# Patient Record
Sex: Female | Born: 1975 | Race: White | Hispanic: No | Marital: Married
Health system: Southern US, Community
[De-identification: ages and names within clinical notes are randomized; demographics above are authoritative.]

## PROBLEM LIST (undated history)

## (undated) HISTORY — PX: TONSILLECTOMY: SUR1361

---

## 2008-11-04 ENCOUNTER — Emergency Department: Payer: Self-pay | Admitting: Emergency Medicine

## 2017-07-21 ENCOUNTER — Other Ambulatory Visit: Payer: Self-pay | Admitting: Obstetrics and Gynecology

## 2017-07-21 DIAGNOSIS — R928 Other abnormal and inconclusive findings on diagnostic imaging of breast: Secondary | ICD-10-CM

## 2017-07-25 ENCOUNTER — Ambulatory Visit
Admission: RE | Admit: 2017-07-25 | Discharge: 2017-07-25 | Disposition: A | Discharge status: Home / Self Care | Payer: BLUE CROSS/BLUE SHIELD | Source: Ambulatory Visit | Attending: Obstetrics and Gynecology | Admitting: Obstetrics and Gynecology

## 2017-07-25 ENCOUNTER — Other Ambulatory Visit: Payer: Self-pay | Admitting: Obstetrics and Gynecology

## 2017-07-25 DIAGNOSIS — N6320 Unspecified lump in the left breast, unspecified quadrant: Secondary | ICD-10-CM

## 2017-07-25 DIAGNOSIS — R928 Other abnormal and inconclusive findings on diagnostic imaging of breast: Secondary | ICD-10-CM

## 2018-01-26 ENCOUNTER — Ambulatory Visit
Admission: RE | Admit: 2018-01-26 | Discharge: 2018-01-26 | Disposition: A | Discharge status: Home / Self Care | Payer: BLUE CROSS/BLUE SHIELD | Source: Ambulatory Visit | Attending: Obstetrics and Gynecology | Admitting: Obstetrics and Gynecology

## 2018-01-26 ENCOUNTER — Other Ambulatory Visit: Payer: Self-pay | Admitting: Obstetrics and Gynecology

## 2018-01-26 DIAGNOSIS — N6320 Unspecified lump in the left breast, unspecified quadrant: Secondary | ICD-10-CM

## 2018-07-23 ENCOUNTER — Other Ambulatory Visit: Payer: Self-pay | Admitting: Obstetrics and Gynecology

## 2018-07-23 DIAGNOSIS — N6320 Unspecified lump in the left breast, unspecified quadrant: Secondary | ICD-10-CM

## 2018-07-27 ENCOUNTER — Ambulatory Visit
Admission: RE | Admit: 2018-07-27 | Discharge: 2018-07-27 | Disposition: A | Discharge status: Home / Self Care | Payer: BLUE CROSS/BLUE SHIELD | Source: Ambulatory Visit | Attending: Obstetrics and Gynecology | Admitting: Obstetrics and Gynecology

## 2018-07-27 ENCOUNTER — Other Ambulatory Visit: Payer: Self-pay

## 2018-07-27 DIAGNOSIS — N6320 Unspecified lump in the left breast, unspecified quadrant: Secondary | ICD-10-CM

## 2019-08-12 ENCOUNTER — Ambulatory Visit: Payer: PRIVATE HEALTH INSURANCE | Attending: Internal Medicine

## 2019-08-12 DIAGNOSIS — Z23 Encounter for immunization: Secondary | ICD-10-CM

## 2019-08-12 NOTE — Progress Notes (Signed)
   Covid-19 Vaccination Clinic  Name:  [name]    MRN: [id number] DOB: Dec 08, 1975  08/12/2019  [name] was observed post Covid-19 immunization for 15 minutes without incident. She was provided with Vaccine Information Sheet and instruction to access the V-Safe system.   [name] was instructed to call 911 with any severe reactions post vaccine: Marland Kitchen Difficulty breathing  . Swelling of face and throat  . A fast heartbeat  . A bad rash all over body  . Dizziness and weakness   Immunizations Administered    Name Date Dose VIS Date Route   Pfizer COVID-19 Vaccine 08/12/2019  6:53 PM 0.3 mL 05/19/2018 Intramuscular   Manufacturer: [employer], [employer]   Lot: [id number]   NDC: [address]-8

## 2019-09-02 ENCOUNTER — Ambulatory Visit: Payer: PRIVATE HEALTH INSURANCE | Attending: Internal Medicine

## 2019-09-02 DIAGNOSIS — Z23 Encounter for immunization: Secondary | ICD-10-CM

## 2019-09-02 NOTE — Progress Notes (Signed)
   Covid-19 Vaccination Clinic  Name:  Ruth Luna    MRN: [id number] DOB: Nov 27, 1975  09/02/2019  Ruth Luna was observed post Covid-19 immunization for 15 minutes without incident. She was provided with Vaccine Information Sheet and instruction to access the V-Safe system.   Ruth Luna was instructed to call 911 with any severe reactions post vaccine: Marland Kitchen Difficulty breathing  . Swelling of face and throat  . A fast heartbeat  . A bad rash all over body  . Dizziness and weakness   Immunizations Administered    Name Date Dose VIS Date Route   Pfizer COVID-19 Vaccine 09/02/2019  4:51 PM 0.3 mL 05/19/2018 Intramuscular   Manufacturer: [employer], [employer]   Lot: [id number]   NDC: [address]-1

## 2019-10-12 IMAGING — MG DIGITAL DIAGNOSTIC BILATERAL MAMMOGRAM WITH TOMO AND CAD
8 series · 8 of 24 positions shown · non-contrast
Comparison: Previous exam(s).

CLINICAL DATA: 43-year-old female presenting for follow-up of
probably benign left breast masses.

EXAM:
DIGITAL DIAGNOSTIC BILATERAL MAMMOGRAM WITH CAD AND TOMO
ULTRASOUND LEFT BREAST

[R CC synth-2D]
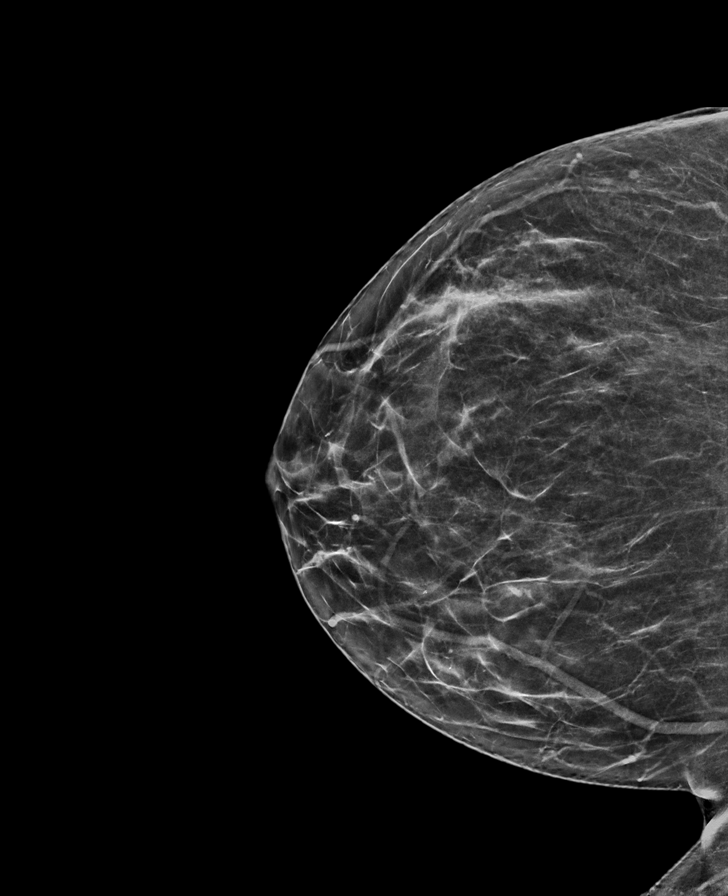

[L CC synth-2D]
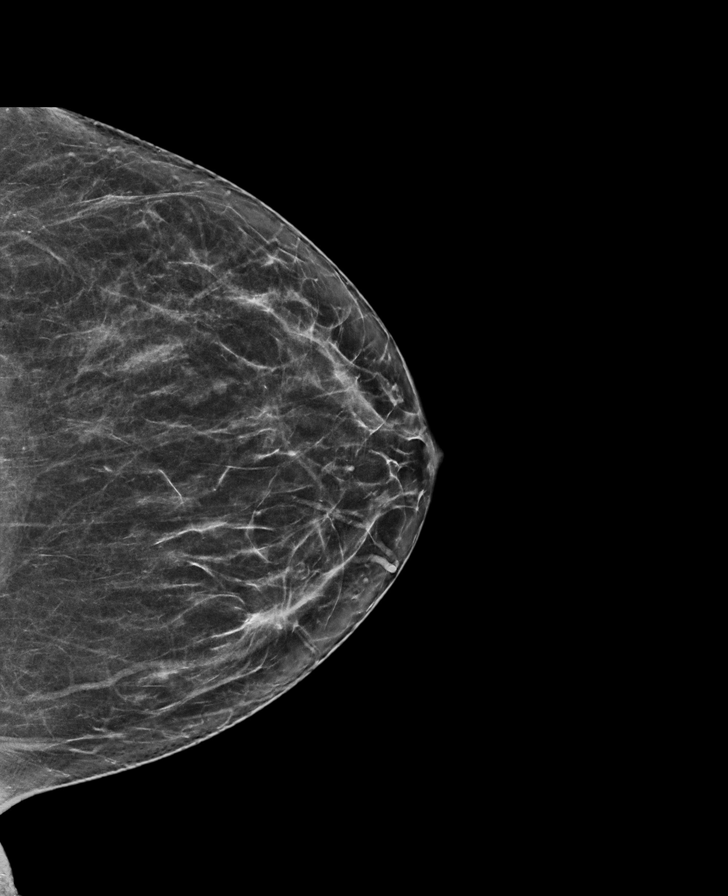

[L MLO synth-2D]
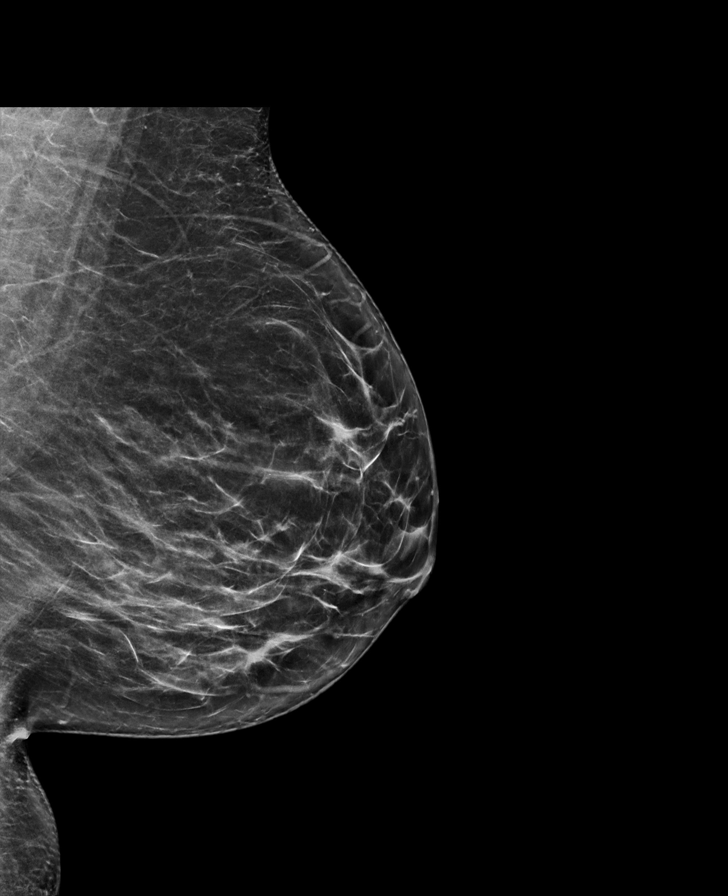

[R MLO synth-2D]
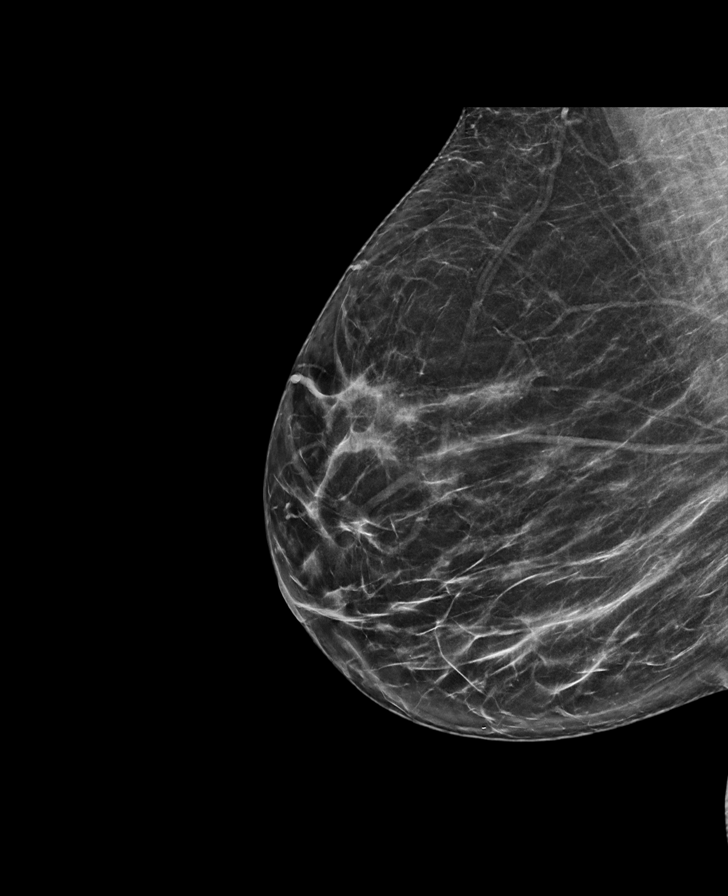

[L CC tomo · tomo slice 35/68.0]
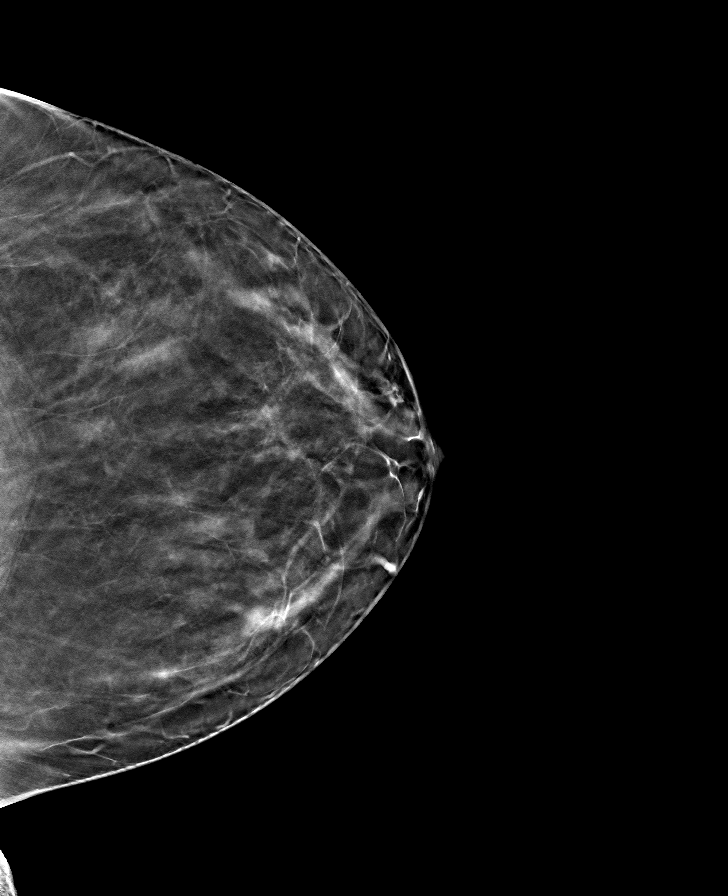

[R MLO tomo · tomo slice 35/69.0]
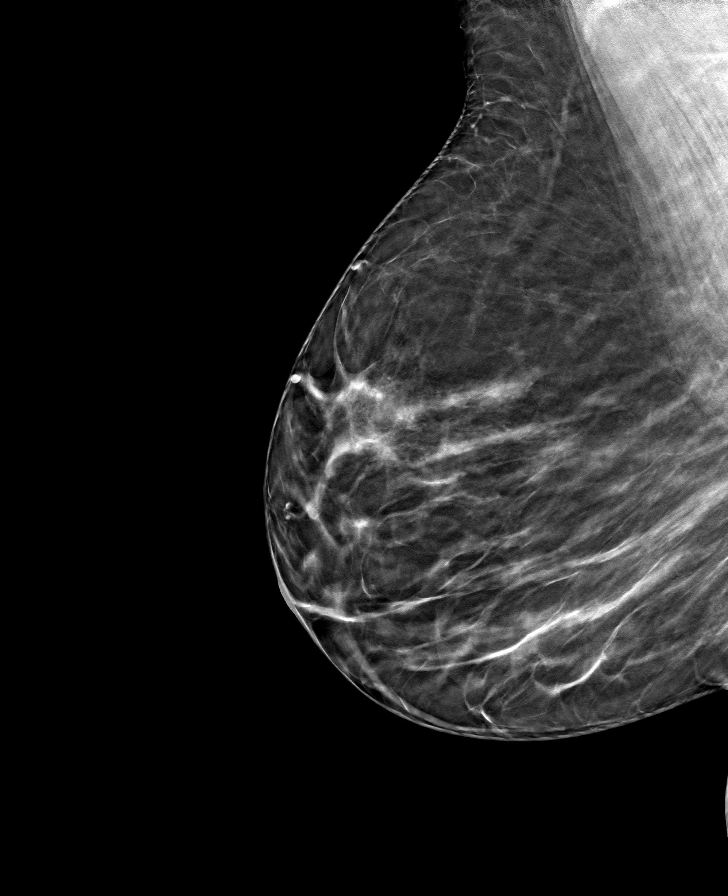

[R CC tomo · tomo slice 33/65.0]
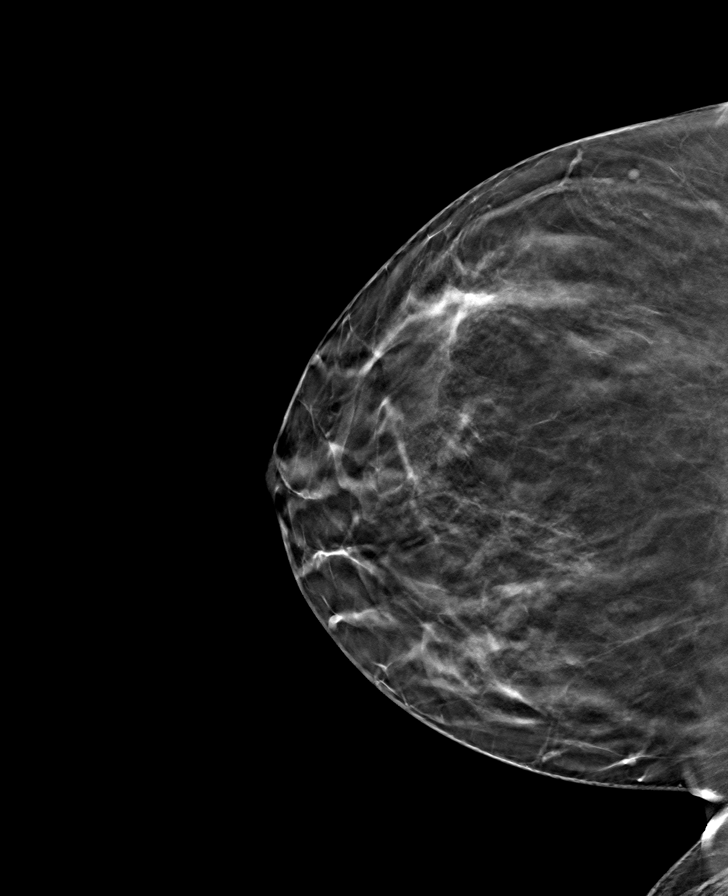

[L MLO tomo · tomo slice 37/72.0]
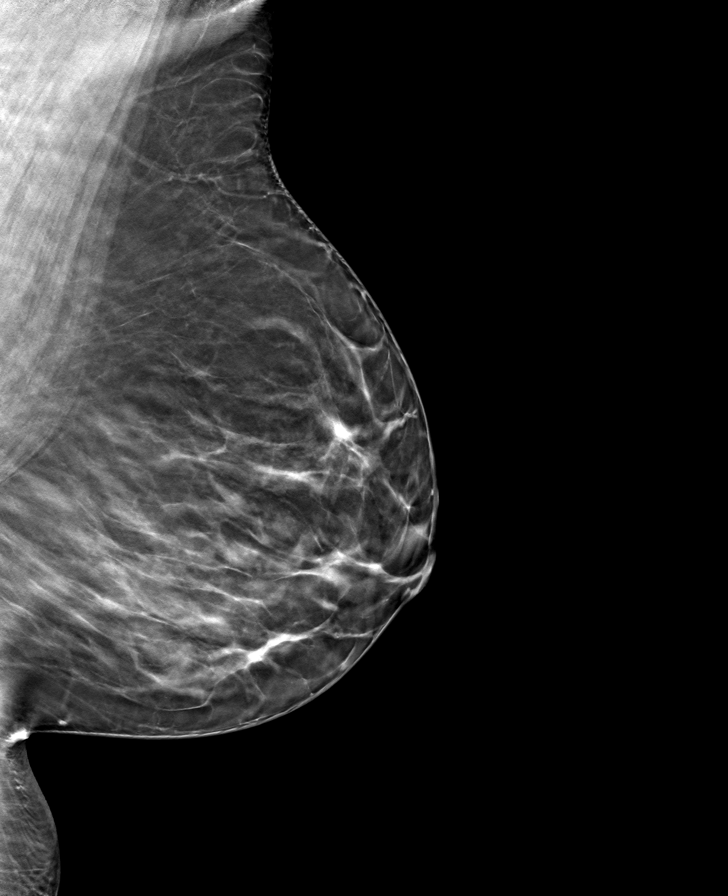

[8 of 24 positions shown; findings below may reference images not displayed]

ACR Breast Density Category b: There are scattered areas of
fibroglandular density.
FINDINGS: Two the circumscribed oval masses previously seen in the central to
lateral left breast have completely resolved, and a third is
markedly smaller. No suspicious calcifications, masses or areas of
distortion are seen in the bilateral breasts.

Mammographic images were processed with CAD.

Ultrasound of the lower outer quadrant of the left breast
demonstrates a benign appearing prominent duct and scattered
fibrocystic change. No suspicious masses are identified.
IMPRESSION: 1. Resolution or significant decrease in size of the probably benign
left breast masses.

2.  No mammographic evidence of malignancy in the bilateral breasts.

RECOMMENDATION:
Screening mammogram in one year.(Code:[id number])

I have discussed the findings and recommendations with the patient.
Results were also provided in writing at the conclusion of the
visit. If applicable, a reminder letter will be sent to the patient
regarding the next appointment.

BI-RADS CATEGORY  2: Benign.

## 2024-01-30 ENCOUNTER — Other Ambulatory Visit: Payer: Self-pay

## 2024-01-30 DIAGNOSIS — Z1231 Encounter for screening mammogram for malignant neoplasm of breast: Secondary | ICD-10-CM

## 2024-02-21 ENCOUNTER — Ambulatory Visit
Admission: EM | Admit: 2024-02-21 | Discharge: 2024-02-21 | Disposition: A | Discharge status: Home / Self Care | Attending: Nurse Practitioner | Admitting: Nurse Practitioner

## 2024-02-21 ENCOUNTER — Other Ambulatory Visit: Payer: Self-pay

## 2024-02-21 DIAGNOSIS — N3001 Acute cystitis with hematuria: Secondary | ICD-10-CM | POA: Diagnosis present

## 2024-02-21 DIAGNOSIS — R319 Hematuria, unspecified: Secondary | ICD-10-CM | POA: Insufficient documentation

## 2024-02-21 DIAGNOSIS — R300 Dysuria: Secondary | ICD-10-CM | POA: Insufficient documentation

## 2024-02-21 LAB — POCT URINE PREGNANCY: Preg Test, Ur: NEGATIVE

## 2024-02-21 LAB — POCT URINE DIPSTICK
Bilirubin, UA: NEGATIVE
Glucose, UA: NEGATIVE mg/dL
Ketones, POC UA: NEGATIVE mg/dL
Nitrite, UA: NEGATIVE
POC PROTEIN,UA: NEGATIVE
Spec Grav, UA: 1.015 (ref 1.010–1.025)
Urobilinogen, UA: 0.2 U/dL
pH, UA: 7 (ref 5.0–8.0)

## 2024-02-21 MED ORDER — NITROFURANTOIN MONOHYD MACRO 100 MG PO CAPS: 100.0000 mg | ORAL_CAPSULE | Freq: Two times a day (BID) | ORAL | 0 refills | Status: AC

## 2024-02-21 MED ORDER — PHENAZOPYRIDINE HCL 200 MG PO TABS: 200.0000 mg | ORAL_TABLET | ORAL | 0 refills | Status: AC

## 2024-02-21 NOTE — Discharge Instructions (Addendum)
 You were seen today for symptoms consistent with a urinary tract infection (UTI). You have been prescribed Macrobid  to treat the infection and Pyridium  to help relieve discomfort such as burning, urgency, and bladder pressure. Take the antibiotics exactly as prescribed and complete the full course, even if you start feeling better. Pyridium  may cause your urine to change color, which is a normal side effect of the medication. A urine culture has been sent to identify the specific bacteria causing the infection and to confirm that the prescribed antibiotic is appropriate. You will only be contacted if your results are abnormal; otherwise, you may review them in your MyChart account.   It is important to stay well hydrated by drinking plenty of fluids throughout the day. This helps flush out your urinary system and keeps your urine light yellow, which is a sign of good hydration. Avoid caffeine and alcohol, as they can irritate the bladder. Be sure to urinate regularly and empty your bladder fully. Do not hold your urine for extended periods. Always wipe from front to back after using the bathroom and use a clean tissue for each wipe. It is also important to urinate after sexual activity. Avoid douching or using sprays or powders in the genital area, as these can cause irritation. Follow up with your healthcare provider if your symptoms do not improve within a few days, get worse, or return after completing your treatment.

## 2024-02-21 NOTE — ED Triage Notes (Signed)
 Pt being seen in UC for blood in urine, burning with urination, and urinary frequency. Pt reports 7/10 pain. Pt reports s/s started yesterday. Pt reports seeing clots in urine yesterday, but none today. Pt denies otc medication.

## 2024-02-21 NOTE — ED Provider Notes (Signed)
 [facility]    CSN: [id number] Arrival date & time: 02/21/24  9171      History   Chief Complaint Chief Complaint  Patient presents with   Hematuria   Urinary Frequency    HPI Ruth Luna is a 48 y.o. female.   Ruth Luna is a 48 year old female who presents with acute onset hematuria beginning around 2:00 PM yesterday. She initially believed the urine discoloration was due to recent consumption of beet smoothies; however, she subsequently observed blood clots in her urine after additional voids. She developed associated urinary frequency with continued rusty-colored urine. By this morning, she reports the urine color appeared more normal. She also experienced lower abdominal discomfort and dysuria, which were more pronounced yesterday and have improved to a milder degree today. She denies fever, chills, nausea, vomiting, vaginal itching, odor, or discharge.  The following sections of the patient's history were reviewed and updated as appropriate: allergies, current medications, past family history, past medical history, past social history, past surgical history, and problem list.     History reviewed. No pertinent past medical history.  There are no active problems to display for this patient.   Past Surgical History:  Procedure Laterality Date   TONSILLECTOMY      OB History   No obstetric history on file.      Home Medications    Prior to Admission medications   Medication Sig Start Date End Date Taking? Authorizing Provider  naproxen (NAPROSYN) 500 MG tablet Take 500 mg by mouth 2 (two) times daily with a meal. 01/29/24  Yes [provider]  nitrofurantoin, macrocrystal-monohydrate, (MACROBID) 100 MG capsule Take 1 capsule (100 mg total) by mouth 2 (two) times daily. 02/21/24  Yes Matthew Cina, FNP  phenazopyridine (PYRIDIUM) 200 MG tablet Take 1 tablet (200 mg total) by mouth 3 (three) times daily at 8am, 3pm and bedtime for 2  days. 02/21/24 02/23/24 Yes Iola Lukes, FNP    Family History History reviewed. No pertinent family history.  Social History Social History   Tobacco Use   Smoking status: Never   Smokeless tobacco: Never  Vaping Use   Vaping status: Never Used  Substance Use Topics   Alcohol use: Yes    Comment: occasional   Drug use: Never     Allergies   Penicillins   Review of Systems Review of Systems  Constitutional:  Negative for chills and fever.  Gastrointestinal:  Positive for abdominal pain. Negative for nausea and vomiting.  Genitourinary:  Positive for dysuria, frequency and hematuria. Negative for menstrual problem (LMP: 02/06/24) and vaginal discharge.       No itching, odor or irritation.  Musculoskeletal:  Negative for myalgias.  All other systems reviewed and are negative.    Physical Exam Triage Vital Signs ED Triage Vitals  Encounter Vitals Group     BP 02/21/24 0853 126/87     Girls Systolic BP Percentile --      Girls Diastolic BP Percentile --      Boys Systolic BP Percentile --      Boys Diastolic BP Percentile --      Pulse Rate 02/21/24 0853 85     Resp 02/21/24 0853 18     Temp 02/21/24 0853 98.4 F (36.9 C)     Temp src --      SpO2 02/21/24 0853 99 %     Weight --      Height --      Head Circumference --  Peak Flow --      Pain Score 02/21/24 0842 7     Pain Loc --      Pain Education --      Exclude from Growth Chart --    No data found.  Updated Vital Signs BP 126/87   Pulse 85   Temp 98.4 F (36.9 C)   Resp 18   LMP 02/14/2024 (Exact Date)   SpO2 99%   Visual Acuity Right Eye Distance:   Left Eye Distance:   Bilateral Distance:    Right Eye Near:   Left Eye Near:    Bilateral Near:     Physical Exam Constitutional:      General: She is not in acute distress.    Appearance: Normal appearance. She is not ill-appearing, toxic-appearing or diaphoretic.  HENT:     Head: Normocephalic.     Nose: Nose normal.      Mouth/Throat:     Mouth: Mucous membranes are moist.  Eyes:     Conjunctiva/sclera: Conjunctivae normal.  Cardiovascular:     Rate and Rhythm: Normal rate.  Pulmonary:     Effort: Pulmonary effort is normal.  Abdominal:     Palpations: Abdomen is soft.  Musculoskeletal:        General: Normal range of motion.     Cervical back: Normal range of motion and neck supple.  Skin:    General: Skin is warm and dry.  Neurological:     General: No focal deficit present.     Mental Status: She is alert and oriented to person, place, and time.  Psychiatric:        Mood and Affect: Mood normal.        Behavior: Behavior normal.      UC Treatments / Results  Labs (all labs ordered are listed, but only abnormal results are displayed) Labs Reviewed  POCT URINE DIPSTICK - Abnormal; Notable for the following components:      Result Value   Clarity, UA turbid (*)    Blood, UA moderate (*)    Leukocytes, UA Small (1+) (*)    All other components within normal limits  URINE CULTURE  POCT URINE PREGNANCY    EKG   Radiology No results found.  Procedures Procedures (including critical care time)  Medications Ordered in UC Medications - No data to display  Initial Impression / Assessment and Plan / UC Course  I have reviewed the triage vital signs and the nursing notes.  Pertinent labs & imaging results that were available during my care of the patient were reviewed by me and considered in my medical decision making (see chart for details).     Patient presents with symptoms consistent with a urinary tract infection. Urinalysis reveals microscopic hematuria, positive nitrites, and leukocyte esterase, supporting the diagnosis. Nitrofurantoin was prescribed to be taken twice daily for 5 days. Pyridium was prescribed for urinary discomfort, to be taken three times daily for 2 days, with counseling that it may turn the urine orange. A urine culture was sent to identify the causative  organism and assess antibiotic sensitivity. Patient was advised that they will be contacted only if the culture results require a change in treatment; otherwise, results can be reviewed via MyChart. Patient instructed to increase fluid intake and monitor symptoms. Follow up with primary care provider if symptoms do not improve or worsen. Emergency evaluation is warranted for fever, back or flank pain, nausea, vomiting, or signs of systemic illness.  Today's  evaluation has revealed no signs of a dangerous process. Discussed diagnosis with patient and/or guardian. Patient and/or guardian aware of their diagnosis, possible red flag symptoms to watch out for and need for close follow up. Patient and/or guardian understands verbal and written discharge instructions. Patient and/or guardian comfortable with plan and disposition.  Patient and/or guardian has a clear mental status at this time, good insight into illness (after discussion and teaching) and has clear judgment to make decisions regarding their care  Documentation was completed with the aid of voice recognition software. Transcription may contain typographical errors.  Final Clinical Impressions(s) / UC Diagnoses   Final diagnoses:  Hematuria, unspecified type  Dysuria  Acute cystitis with hematuria     Discharge Instructions      You were seen today for symptoms consistent with a urinary tract infection (UTI). You have been prescribed Macrobid to treat the infection and Pyridium to help relieve discomfort such as burning, urgency, and bladder pressure. Take the antibiotics exactly as prescribed and complete the full course, even if you start feeling better. Pyridium  may cause your urine to change color, which is a normal side effect of the medication. A urine culture has been sent to identify the specific bacteria causing the infection and to confirm that the prescribed antibiotic is appropriate. You will only be contacted if your results  are abnormal; otherwise, you may review them in your MyChart account.   It is important to stay well hydrated by drinking plenty of fluids throughout the day. This helps flush out your urinary system and keeps your urine light yellow, which is a sign of good hydration. Avoid caffeine and alcohol, as they can irritate the bladder. Be sure to urinate regularly and empty your bladder fully. Do not hold your urine for extended periods. Always wipe from front to back after using the bathroom and use a clean tissue for each wipe. It is also important to urinate after sexual activity. Avoid douching or using sprays or powders in the genital area, as these can cause irritation. Follow up with your healthcare provider if your symptoms do not improve within a few days, get worse, or return after completing your treatment.      ED Prescriptions     Medication Sig Dispense Auth. Provider   nitrofurantoin, macrocrystal-monohydrate, (MACROBID) 100 MG capsule Take 1 capsule (100 mg total) by mouth 2 (two) times daily. 10 capsule Iola Lukes, FNP   phenazopyridine (PYRIDIUM) 200 MG tablet Take 1 tablet (200 mg total) by mouth 3 (three) times daily at 8am, 3pm and bedtime for 2 days. 6 tablet Iola Lukes, FNP      PDMP not reviewed this encounter.   Iola Lukes, [place] 02/21/24 1114

## 2024-02-23 ENCOUNTER — Ambulatory Visit (HOSPITAL_COMMUNITY): Payer: Self-pay

## 2024-02-23 LAB — URINE CULTURE: Culture: >=100000 — AB

## 2024-02-27 ENCOUNTER — Encounter: Payer: Self-pay | Admitting: Emergency Medicine

## 2024-02-27 ENCOUNTER — Ambulatory Visit
Admission: EM | Admit: 2024-02-27 | Discharge: 2024-02-27 | Disposition: A | Discharge status: Home / Self Care | Attending: Emergency Medicine | Admitting: Emergency Medicine

## 2024-02-27 DIAGNOSIS — N3001 Acute cystitis with hematuria: Secondary | ICD-10-CM

## 2024-02-27 DIAGNOSIS — R3915 Urgency of urination: Secondary | ICD-10-CM

## 2024-02-27 LAB — POCT URINE DIPSTICK
Bilirubin, UA: NEGATIVE
Glucose, UA: NEGATIVE mg/dL
Ketones, POC UA: NEGATIVE mg/dL
Nitrite, UA: NEGATIVE
POC PROTEIN,UA: NEGATIVE
Spec Grav, UA: <=1.005 — AB (ref 1.010–1.025)
Urobilinogen, UA: 0.2 U/dL
pH, UA: 6 (ref 5.0–8.0)

## 2024-02-27 MED ORDER — CIPROFLOXACIN HCL 500 MG PO TABS: 500.0000 mg | ORAL_TABLET | Freq: Two times a day (BID) | ORAL | 0 refills | Status: AC

## 2024-02-27 NOTE — Discharge Instructions (Signed)
 After seeing her and symptoms did not fully resolve after treatment with Macrobid  we will attempt additional antibiotics chosen based off your last urine culture which shows E. coli, E. coli is a very common bacteria causing urinary symptoms in women  Take ciprofloxacin  twice daily for 5 days  Urine sample today shows blood and Ruth Luna blood cells, has been sent to the lab for 3 days to isolate determine, if additional adjustments to your medicine need to be made we will call and notify you  You may use over-the-counter Azo to help minimize your symptoms until antibiotic removes bacteria, this medication will turn your urine orange  Increase your fluid intake through use of water  As always practice good hygiene, wiping front to back and avoidance of scented vaginal products to prevent further irritation  If symptoms continue to persist after use of medication or recur please follow-up with urgent care or your primary doctor as needed

## 2024-02-27 NOTE — ED Provider Notes (Signed)
 [facility]    CSN: [id number] Arrival date & time: 02/27/24  1348      History   Chief Complaint Chief Complaint  Patient presents with   Urinary Frequency   Dysuria   Hematuria    HPI Ruthetta Koopmann is a 48 y.o. female.   Patient presents for evaluation of persisting urinary frequency urgency dysuria and hematuria beginning 6 days ago.  Endorses associated right lower back and right pelvic pain.  Was evaluated in this urgent care on 02/21/2024, prescribed Macrobid  for treatment of UTI, endorses improvement in symptoms for 2 days before flaring.  Denies fever.  Additionally was prescribed Pyridium  at that time, has not attempted any additional treatment.   History reviewed. No pertinent past medical history.  There are no active problems to display for this patient.   Past Surgical History:  Procedure Laterality Date   TONSILLECTOMY      OB History   No obstetric history on file.      Home Medications    Prior to Admission medications   Medication Sig Start Date End Date Taking? Authorizing Provider  naproxen (NAPROSYN) 500 MG tablet Take 500 mg by mouth 2 (two) times daily with a meal. 01/29/24   [provider]  nitrofurantoin , macrocrystal-monohydrate, (MACROBID ) 100 MG capsule Take 1 capsule (100 mg total) by mouth 2 (two) times daily. 02/21/24   Iola Lukes, FNP    Family History History reviewed. No pertinent family history.  Social History Social History   Tobacco Use   Smoking status: Never   Smokeless tobacco: Never  Vaping Use   Vaping status: Never Used  Substance Use Topics   Alcohol use: Yes    Comment: occasional   Drug use: Never     Allergies   Penicillins   Review of Systems Review of Systems  Genitourinary:  Positive for dysuria, frequency and hematuria. Negative for decreased urine volume, difficulty urinating, dyspareunia, enuresis, flank pain, genital sores, menstrual problem, pelvic pain, urgency,  vaginal bleeding, vaginal discharge and vaginal pain.     Physical Exam Triage Vital Signs ED Triage Vitals  Encounter Vitals Group     BP 02/27/24 1402 113/79     Girls Systolic BP Percentile --      Girls Diastolic BP Percentile --      Boys Systolic BP Percentile --      Boys Diastolic BP Percentile --      Pulse Rate 02/27/24 1402 89     Resp 02/27/24 1402 18     Temp 02/27/24 1402 98.4 F (36.9 C)     Temp Source 02/27/24 1402 Oral     SpO2 02/27/24 1402 100 %     Weight --      Height --      Head Circumference --      Peak Flow --      Pain Score 02/27/24 1400 0     Pain Loc --      Pain Education --      Exclude from Growth Chart --    No data found.  Updated Vital Signs BP 113/79 (BP Location: Left Arm)   Pulse 89   Temp 98.4 F (36.9 C) (Oral)   Resp 18   LMP 02/14/2024 (Exact Date)   SpO2 100%   Visual Acuity Right Eye Distance:   Left Eye Distance:   Bilateral Distance:    Right Eye Near:   Left Eye Near:    Bilateral Near:  Physical Exam Constitutional:      Appearance: Normal appearance.  Eyes:     Extraocular Movements: Extraocular movements intact.  Pulmonary:     Effort: Pulmonary effort is normal.  Abdominal:     Tenderness: There is no abdominal tenderness. There is no right CVA tenderness or left CVA tenderness.  Neurological:     Mental Status: She is alert and oriented to person, place, and time.      UC Treatments / Results  Labs (all labs ordered are listed, but only abnormal results are displayed) Labs Reviewed  POCT URINE DIPSTICK - Abnormal; Notable for the following components:      Result Value   Color, UA other (*)    Spec Grav, UA <=1.005 (*)    Blood, UA large (*)    Leukocytes, UA Large (3+) (*)    All other components within normal limits  URINE CULTURE    EKG   Radiology No results found.  Procedures Procedures (including critical care time)  Medications Ordered in UC Medications - No data to  display  Initial Impression / Assessment and Plan / UC Course  I have reviewed the triage vital signs and the nursing notes.  Pertinent labs & imaging results that were available during my care of the patient were reviewed by me and considered in my medical decision making (see chart for details).  Acute cystitis with hematuria, urinary urgency  Urinalysis showing leukocytes and hemoglobin, negative for nitrates, sent for culture, urine culture 6 days ago showing E. coli and patient was prescribed Macrobid  which was compatible, at this time we will initiate Cipro  based on culture results, recommended continued supportive care with follow-up as needed  Final Clinical Impressions(s) / UC Diagnoses   Final diagnoses:  Urinary urgency   Discharge Instructions   None    ED Prescriptions   None    PDMP not reviewed this encounter.   Teresa Shelba SAUNDERS, NP 02/27/24 1441

## 2024-02-27 NOTE — ED Triage Notes (Signed)
 Patient reports urinary frequency, painful urination and blood in urinary x 6 days. Patient was seen [facility] for the same on 02/21/24. Patient reports that she has taken all the medication that was prescribed.

## 2024-02-29 LAB — URINE CULTURE: Culture: 20000 — AB

## 2024-03-01 ENCOUNTER — Ambulatory Visit (HOSPITAL_COMMUNITY): Payer: Self-pay
